# Patient Record
Sex: Female | Born: 1975 | Race: White | Hispanic: No | Marital: Single | Smoking: Former smoker
Health system: Southern US, Community
[De-identification: ages and names within clinical notes are randomized; demographics above are authoritative.]

## PROBLEM LIST (undated history)

## (undated) DIAGNOSIS — J45909 Unspecified asthma, uncomplicated: Secondary | ICD-10-CM

## (undated) HISTORY — PX: OTHER SURGICAL HISTORY: SHX169

---

## 2008-11-20 ENCOUNTER — Emergency Department (HOSPITAL_COMMUNITY): Admission: EM | Admit: 2008-11-20 | Discharge: 2008-11-20 | Payer: Self-pay | Admitting: Emergency Medicine

## 2009-01-06 ENCOUNTER — Emergency Department (HOSPITAL_COMMUNITY): Admission: EM | Admit: 2009-01-06 | Discharge: 2009-01-06 | Payer: Self-pay | Admitting: Emergency Medicine

## 2010-09-05 LAB — URINALYSIS, ROUTINE W REFLEX MICROSCOPIC
Bilirubin Urine: NEGATIVE
Ketones, ur: 15 mg/dL — AB
Nitrite: NEGATIVE
Protein, ur: NEGATIVE mg/dL
Specific Gravity, Urine: 1.01 (ref 1.005–1.030)
Urobilinogen, UA: 0.2 mg/dL (ref 0.0–1.0)

## 2010-09-05 LAB — DIFFERENTIAL
Eosinophils Absolute: 0.1 10*3/uL (ref 0.0–0.7)
Lymphs Abs: 1.3 10*3/uL (ref 0.7–4.0)
Monocytes Absolute: 1.1 10*3/uL — ABNORMAL HIGH (ref 0.1–1.0)
Monocytes Relative: 7 % (ref 3–12)
Neutro Abs: 13 10*3/uL — ABNORMAL HIGH (ref 1.7–7.7)
Neutrophils Relative %: 84 % — ABNORMAL HIGH (ref 43–77)

## 2010-09-05 LAB — BASIC METABOLIC PANEL
CO2: 24 mEq/L (ref 19–32)
Calcium: 8.1 mg/dL — ABNORMAL LOW (ref 8.4–10.5)
GFR calc Af Amer: >60 mL/min (ref >60)
Glucose, Bld: 99 mg/dL (ref 70–99)
Potassium: 3.4 mEq/L — ABNORMAL LOW (ref 3.5–5.1)
Sodium: 136 mEq/L (ref 135–145)

## 2010-09-05 LAB — URINE MICROSCOPIC-ADD ON

## 2010-09-05 LAB — CBC
HCT: 33.2 % — ABNORMAL LOW (ref 36.0–46.0)
Hemoglobin: 11.3 g/dL — ABNORMAL LOW (ref 12.0–15.0)
MCHC: 34.1 g/dL (ref 30.0–36.0)
RBC: 4.13 MIL/uL (ref 3.87–5.11)

## 2013-08-05 ENCOUNTER — Emergency Department (HOSPITAL_COMMUNITY): Payer: Self-pay

## 2013-08-05 ENCOUNTER — Encounter (HOSPITAL_COMMUNITY): Payer: Self-pay | Admitting: Emergency Medicine

## 2013-08-05 ENCOUNTER — Emergency Department (HOSPITAL_COMMUNITY)
Admission: EM | Admit: 2013-08-05 | Discharge: 2013-08-05 | Disposition: A | Discharge status: Home / Self Care | Payer: Self-pay | Attending: Emergency Medicine | Admitting: Emergency Medicine

## 2013-08-05 DIAGNOSIS — Z79899 Other long term (current) drug therapy: Secondary | ICD-10-CM | POA: Insufficient documentation

## 2013-08-05 DIAGNOSIS — J209 Acute bronchitis, unspecified: Secondary | ICD-10-CM | POA: Insufficient documentation

## 2013-08-05 DIAGNOSIS — R509 Fever, unspecified: Secondary | ICD-10-CM | POA: Insufficient documentation

## 2013-08-05 DIAGNOSIS — Z87891 Personal history of nicotine dependence: Secondary | ICD-10-CM | POA: Insufficient documentation

## 2013-08-05 LAB — CBC WITH DIFFERENTIAL/PLATELET
BASOS ABS: 0 10*3/uL (ref 0.0–0.1)
Basophils Relative: 0 % (ref 0–1)
Eosinophils Absolute: 0.5 10*3/uL (ref 0.0–0.7)
Eosinophils Relative: 6 % — ABNORMAL HIGH (ref 0–5)
HEMATOCRIT: 44.3 % (ref 36.0–46.0)
Hemoglobin: 14.9 g/dL (ref 12.0–15.0)
LYMPHS PCT: 42 % (ref 12–46)
Lymphs Abs: 4 10*3/uL (ref 0.7–4.0)
MCH: 29.9 pg (ref 26.0–34.0)
MCHC: 33.6 g/dL (ref 30.0–36.0)
MCV: 89 fL (ref 78.0–100.0)
Monocytes Absolute: 0.4 10*3/uL (ref 0.1–1.0)
Monocytes Relative: 4 % (ref 3–12)
NEUTROS ABS: 4.6 10*3/uL (ref 1.7–7.7)
NEUTROS PCT: 48 % (ref 43–77)
PLATELETS: 315 10*3/uL (ref 150–400)
RBC: 4.98 MIL/uL (ref 3.87–5.11)
RDW: 13.6 % (ref 11.5–15.5)
WBC: 9.5 10*3/uL (ref 4.0–10.5)

## 2013-08-05 LAB — COMPREHENSIVE METABOLIC PANEL
ALBUMIN: 4 g/dL (ref 3.5–5.2)
ALT: 14 U/L (ref 0–35)
AST: 19 U/L (ref 0–37)
Alkaline Phosphatase: 71 U/L (ref 39–117)
BILIRUBIN TOTAL: 0.2 mg/dL — AB (ref 0.3–1.2)
BUN: 7 mg/dL (ref 6–23)
CHLORIDE: 106 meq/L (ref 96–112)
CO2: 27 meq/L (ref 19–32)
Calcium: 9 mg/dL (ref 8.4–10.5)
Creatinine, Ser: 0.79 mg/dL (ref 0.50–1.10)
GFR calc Af Amer: >90 mL/min (ref >90)
Glucose, Bld: 85 mg/dL (ref 70–99)
POTASSIUM: 4.5 meq/L (ref 3.7–5.3)
SODIUM: 143 meq/L (ref 137–147)
Total Protein: 7.7 g/dL (ref 6.0–8.3)

## 2013-08-05 MED ORDER — PREDNISONE 20 MG PO TABS: 60.0000 mg | ORAL_TABLET | Freq: Every day | ORAL | Status: DC

## 2013-08-05 MED ORDER — ALBUTEROL SULFATE HFA 108 (90 BASE) MCG/ACT IN AERS
2.0000 | INHALATION_SPRAY | RESPIRATORY_TRACT | Status: DC | PRN
  Administered 2013-08-05: 2 via RESPIRATORY_TRACT
  Filled 2013-08-05: qty 6.7

## 2013-08-05 MED ORDER — METHYLPREDNISOLONE SODIUM SUCC 125 MG IJ SOLR
125.0000 mg | Freq: Once | INTRAMUSCULAR | Status: AC
  Administered 2013-08-05: 125 mg via INTRAVENOUS
  Filled 2013-08-05: qty 2

## 2013-08-05 MED ORDER — IPRATROPIUM-ALBUTEROL 0.5-2.5 (3) MG/3ML IN SOLN: 3.0000 mL | RESPIRATORY_TRACT | Status: DC

## 2013-08-05 MED ORDER — ALBUTEROL SULFATE (2.5 MG/3ML) 0.083% IN NEBU
2.5000 mg | INHALATION_SOLUTION | Freq: Once | RESPIRATORY_TRACT | Status: AC
  Administered 2013-08-05: 2.5 mg via RESPIRATORY_TRACT
  Filled 2013-08-05: qty 3

## 2013-08-05 MED ORDER — AMOXICILLIN 500 MG PO CAPS: 500.0000 mg | ORAL_CAPSULE | Freq: Three times a day (TID) | ORAL | Status: DC

## 2013-08-05 MED ORDER — IPRATROPIUM-ALBUTEROL 0.5-2.5 (3) MG/3ML IN SOLN
3.0000 mL | Freq: Once | RESPIRATORY_TRACT | Status: AC
  Administered 2013-08-05: 3 mL via RESPIRATORY_TRACT
  Filled 2013-08-05: qty 3

## 2013-08-05 MED ORDER — ALBUTEROL SULFATE HFA 108 (90 BASE) MCG/ACT IN AERS: 2.0000 | INHALATION_SPRAY | RESPIRATORY_TRACT | Status: AC | PRN

## 2013-08-05 NOTE — ED Notes (Signed)
RT notified of orders 

## 2013-08-05 NOTE — ED Notes (Signed)
PT C/O SOB. NO RELIEF WITH INHALER.

## 2013-08-05 NOTE — Discharge Instructions (Signed)

## 2013-08-05 NOTE — ED Notes (Signed)
After neb tx pt state sshe feels much better, breath sounds clear bilaterally. Breathing unlabored

## 2013-08-05 NOTE — ED Provider Notes (Signed)
CSN: [id number]     Arrival date & time 08/05/13  1906 History   First MD Initiated Contact with Patient 08/05/13 1916     Chief Complaint  Patient presents with  . Shortness of Breath     (Consider location/radiation/quality/duration/timing/severity/associated sxs/prior Treatment) HPI Comments: Patient presents to the ER for evaluation of difficulty breathing. Patient reports that she has been having trouble with intermittent shortness of breath and wheezing over a period of approximately 4 months. She is been borrowing inhalers from her family and using over-the-counter Mucinex. The last couple of days, her shortness of breath has significantly worsened. She thinks she has been running fevers at home. She has had persistent cough. She reports that the last time she felt like she had walking pneumonia.  Patient is a 38 y.o. female presenting with shortness of breath.  Shortness of Breath Associated symptoms: cough, fever and wheezing     History reviewed. No pertinent past medical history. Past Surgical History  Procedure Laterality Date  . Arm surgery     History reviewed. No pertinent family history. History  Substance Use Topics  . Smoking status: Former Games developer  . Smokeless tobacco: Not on file  . Alcohol Use: No   OB History   Grav Para Term Preterm Abortions TAB SAB Ect Mult Living                 Review of Systems  Constitutional: Positive for fever.  Respiratory: Positive for cough, shortness of breath and wheezing.   All other systems reviewed and are negative.      Allergies  Aspirin  Home Medications   Current Outpatient Rx  Name  Route  Sig  Dispense  Refill  . Cyanocobalamin (B-12 PO)   Oral   Take 1 tablet by mouth daily.         Marland Kitchen ibuprofen (ADVIL,MOTRIN) 200 MG tablet   Oral   Take 200 mg by mouth every 6 (six) hours as needed for fever or mild pain.         . IRON PO   Oral   Take 1 tablet by mouth daily.         . Multiple Vitamin  (MULTIVITAMIN WITH MINERALS) TABS tablet   Oral   Take 1 tablet by mouth daily.         Marland Kitchen Phenylephrine-APAP-Guaifenesin (MUCINEX FAST-MAX COLD & SINUS) 10-650-400 MG/20ML LIQD   Oral   Take 15 mLs by mouth every 4 (four) hours as needed (for congestion/relief).         . Pseudoeph-Doxylamine-DM-APAP (NYQUIL MULTI-SYMPTOM PO)   Oral   Take 10-15 mLs by mouth daily as needed (for congestion/relief).          SpO2 99%  LMP 07/20/2013 Physical Exam  Constitutional: She is oriented to person, place, and time. She appears well-developed and well-nourished. No distress.  HENT:  Head: Normocephalic and atraumatic.  Right Ear: Hearing normal.  Left Ear: Hearing normal.  Nose: Nose normal.  Mouth/Throat: Oropharynx is clear and moist and mucous membranes are normal.  Eyes: Conjunctivae and EOM are normal. Pupils are equal, round, and reactive to light.  Neck: Normal range of motion. Neck supple.  Cardiovascular: Regular rhythm, S1 normal and S2 normal.  Exam reveals no gallop and no friction rub.   No murmur heard. Pulmonary/Chest: She is in respiratory distress. She has wheezes. She exhibits no tenderness.  Abdominal: Soft. Normal appearance and bowel sounds are normal. There is no hepatosplenomegaly. There is  no tenderness. There is no rebound, no guarding, no tenderness at McBurney's point and negative Murphy's sign. No hernia.  Musculoskeletal: Normal range of motion.  Neurological: She is alert and oriented to person, place, and time. She has normal strength. No cranial nerve deficit or sensory deficit. Coordination normal. GCS eye subscore is 4. GCS verbal subscore is 5. GCS motor subscore is 6.  Skin: Skin is warm, dry and intact. No rash noted. No cyanosis.  Psychiatric: She has a normal mood and affect. Her speech is normal and behavior is normal. Thought content normal.    ED Course  Procedures (including critical care time) Labs Review Labs Reviewed  CBC WITH  DIFFERENTIAL - Abnormal; Notable for the following:    Eosinophils Relative 6 (*)    All other components within normal limits  COMPREHENSIVE METABOLIC PANEL - Abnormal; Notable for the following:    Total Bilirubin 0.2 (*)    All other components within normal limits   Imaging Review Dg Chest 2 View  08/05/2013   CLINICAL DATA Shortness of breath for 4 months  EXAM CHEST  2 VIEW  COMPARISON None.  FINDINGS The heart size and mediastinal contours are within normal limits. Both lungs are clear. The visualized skeletal structures are unremarkable.  IMPRESSION No active cardiopulmonary disease.  SIGNATURE  Electronically Signed   By: Elige KoHetal  Patel   On: 08/05/2013 20:16     EKG Interpretation None      MDM   Final diagnoses:  Bronchitis with bronchospasm     Patient presented to the ER with moderate respiratory distress secondary to bronchospasm. She reports a history of recurrent bronchitis but no firm diagnosis of asthma. She has been using albuterol inhalers intermittently, given to her by family members. Tonight, however, she had significant wheezing without significant hypoxia. She significantly improved after nebulized albuterol and Atrovent. Repeat examination reveals that she is breathing comfortably without wheezing. Her x-ray was clear, no pneumonia. She is appropriate for outpatient treatment with continued prednisone, albuterol and will add antibiotic coverage, amoxicillin. Return to the ER if symptoms worsen.    Gilda Creasehristopher J. Pollina, MD 08/05/13 2036

## 2020-09-10 ENCOUNTER — Emergency Department (HOSPITAL_COMMUNITY): Payer: Medicaid - Out of State

## 2020-09-10 ENCOUNTER — Emergency Department (HOSPITAL_COMMUNITY)
Admission: EM | Admit: 2020-09-10 | Discharge: 2020-09-10 | Disposition: A | Discharge status: Home / Self Care | Payer: Medicaid - Out of State | Attending: Emergency Medicine | Admitting: Emergency Medicine

## 2020-09-10 ENCOUNTER — Encounter (HOSPITAL_COMMUNITY): Payer: Self-pay

## 2020-09-10 ENCOUNTER — Other Ambulatory Visit: Payer: Self-pay

## 2020-09-10 DIAGNOSIS — R059 Cough, unspecified: Secondary | ICD-10-CM | POA: Diagnosis not present

## 2020-09-10 DIAGNOSIS — Z91040 Latex allergy status: Secondary | ICD-10-CM | POA: Insufficient documentation

## 2020-09-10 DIAGNOSIS — R000 Tachycardia, unspecified: Secondary | ICD-10-CM | POA: Insufficient documentation

## 2020-09-10 DIAGNOSIS — R062 Wheezing: Secondary | ICD-10-CM | POA: Diagnosis not present

## 2020-09-10 DIAGNOSIS — Z79899 Other long term (current) drug therapy: Secondary | ICD-10-CM | POA: Diagnosis not present

## 2020-09-10 DIAGNOSIS — Z87891 Personal history of nicotine dependence: Secondary | ICD-10-CM | POA: Diagnosis not present

## 2020-09-10 DIAGNOSIS — R0602 Shortness of breath: Secondary | ICD-10-CM | POA: Diagnosis present

## 2020-09-10 DIAGNOSIS — R0902 Hypoxemia: Secondary | ICD-10-CM | POA: Insufficient documentation

## 2020-09-10 HISTORY — DX: Unspecified asthma, uncomplicated: J45.909

## 2020-09-10 LAB — CBC
HCT: 46.5 % — ABNORMAL HIGH (ref 36.0–46.0)
Hemoglobin: 13.9 g/dL (ref 12.0–15.0)
MCH: 26.3 pg (ref 26.0–34.0)
MCHC: 29.9 g/dL — ABNORMAL LOW (ref 30.0–36.0)
MCV: 88.1 fL (ref 80.0–100.0)
Platelets: 385 10*3/uL (ref 150–400)
RBC: 5.28 MIL/uL — ABNORMAL HIGH (ref 3.87–5.11)
RDW: 15.5 % (ref 11.5–15.5)
WBC: 11.3 10*3/uL — ABNORMAL HIGH (ref 4.0–10.5)
nRBC: 0 % (ref 0.0–0.2)

## 2020-09-10 LAB — BASIC METABOLIC PANEL
Anion gap: 10 (ref 5–15)
BUN: 15 mg/dL (ref 6–20)
CO2: 24 mmol/L (ref 22–32)
Calcium: 8.3 mg/dL — ABNORMAL LOW (ref 8.9–10.3)
Chloride: 104 mmol/L (ref 98–111)
Creatinine, Ser: 0.82 mg/dL (ref 0.44–1.00)
GFR, Estimated: >60 mL/min (ref >60)
Glucose, Bld: 132 mg/dL — ABNORMAL HIGH (ref 70–99)
Potassium: 3.7 mmol/L (ref 3.5–5.1)
Sodium: 138 mmol/L (ref 135–145)

## 2020-09-10 MED ORDER — IOHEXOL 350 MG/ML SOLN
100.0000 mL | Freq: Once | INTRAVENOUS | Status: AC | PRN
  Administered 2020-09-10: 100 mL via INTRAVENOUS

## 2020-09-10 MED ORDER — PREDNISONE 10 MG PO TABS
60.0000 mg | ORAL_TABLET | Freq: Once | ORAL | Status: AC
  Administered 2020-09-10: 60 mg via ORAL
  Filled 2020-09-10: qty 1

## 2020-09-10 MED ORDER — PREDNISONE 20 MG PO TABS: 40.0000 mg | ORAL_TABLET | Freq: Every day | ORAL | 0 refills | Status: AC

## 2020-09-10 MED ORDER — ALBUTEROL SULFATE HFA 108 (90 BASE) MCG/ACT IN AERS
2.0000 | INHALATION_SPRAY | RESPIRATORY_TRACT | Status: DC | PRN
Start: 2020-09-10 — End: 2020-09-11
  Administered 2020-09-10: 2 via RESPIRATORY_TRACT
  Filled 2020-09-10: qty 6.7

## 2020-09-10 NOTE — ED Triage Notes (Signed)
Pt reports shortness of breath that has been intermittent for months. Pt says she has been seen by different hospitals and prescribed numerous anabiotics with no relief.  Pt is short of breath after walking from lobby to ED room 7. Pt O2 is 90% upon arrival to room, then comes up to 95% after sitting for a few minutes.

## 2020-09-10 NOTE — ED Provider Notes (Signed)
[facility] EMERGENCY DEPARTMENT Provider Note   CSN: [id number] Arrival date & time: 09/10/20  2019     History Chief Complaint  Patient presents with  . Shortness of Breath    Emily Powers is a 45 y.o. female.  HPI Patient presents with shortness of breath.  Reportedly has had for months.  States it may have started after pneumonia around a year ago.  States she is been told her lungs are scarred.  States that she has followed up only in the ER.  States she gets inhalers and sometimes steroids and antibiotics.  States she does better while she is on the steroids and antibiotics.  Has never followed with pulmonologist.  States she has had her COVID vaccines and actually gets worse after she has had her vaccines.  Had been a former smoker years ago.  No chest pain.  Has had some sputum production.  Did get short of breath with sats down to 90 just to walk back into the room.  Patient has Singulair inhaler and albuterol inhaler but the albuterol inhaler is empty.  Also has Keflex prescription.    Past Medical History:  Diagnosis Date  . Asthma     There are no problems to display for this patient.   Past Surgical History:  Procedure Laterality Date  . ARM SURGERY       OB History   No obstetric history on file.     No family history on file.  Social History   Tobacco Use  . Smoking status: Former Games developer  . Smokeless tobacco: Never Used  . Tobacco comment: former social smoker years ago  Substance Use Topics  . Alcohol use: No  . Drug use: No    Home Medications Prior to Admission medications   Medication Sig Start Date End Date Taking? Authorizing Provider  albuterol (PROVENTIL HFA;VENTOLIN HFA) 108 (90 BASE) MCG/ACT inhaler Inhale 2 puffs into the lungs every 4 (four) hours as needed for wheezing or shortness of breath. 08/05/13  Yes Pollina, Canary Brim, MD  cephALEXin (KEFLEX) 500 MG capsule Take 500 mg by mouth every 6 (six) hours as needed.   Yes  [provider]  naproxen sodium (ALEVE) 220 MG tablet Take 220 mg by mouth.   Yes [provider]  predniSONE (DELTASONE) 20 MG tablet Take 2 tablets (40 mg total) by mouth daily for 5 days. 09/10/20 09/15/20 Yes Benjiman Core, MD  Cyanocobalamin (B-12 PO) Take 1 tablet by mouth daily. Patient not taking: Reported on 09/10/2020    [provider]  ibuprofen (ADVIL,MOTRIN) 200 MG tablet Take 200 mg by mouth every 6 (six) hours as needed for fever or mild pain. Patient not taking: Reported on 09/10/2020    [provider]  IRON PO Take 1 tablet by mouth daily. Patient not taking: Reported on 09/10/2020    [provider]  Multiple Vitamin (MULTIVITAMIN WITH MINERALS) TABS tablet Take 1 tablet by mouth daily. Patient not taking: Reported on 09/10/2020    [provider]  Phenylephrine-APAP-guaiFENesin 10-650-400 MG/20ML LIQD Take 15 mLs by mouth every 4 (four) hours as needed (for congestion/relief). Patient not taking: Reported on 09/10/2020    [provider]  Pseudoeph-Doxylamine-DM-APAP (NYQUIL MULTI-SYMPTOM PO) Take 10-15 mLs by mouth daily as needed (for congestion/relief). Patient not taking: Reported on 09/10/2020    [provider]    Allergies    Latex and Aspirin  Review of Systems   Review of Systems  Constitutional: Negative  for appetite change.  HENT: Positive for congestion.   Respiratory: Positive for cough, shortness of breath and wheezing.   Cardiovascular: Negative for chest pain and leg swelling.  Genitourinary: Negative for flank pain.  Musculoskeletal: Negative for back pain.  Skin: Negative for rash.  Neurological: Negative for weakness.  Psychiatric/Behavioral: Negative for confusion.    Physical Exam Updated Vital Signs BP (!) 101/54   Pulse 97   Temp 98.2 F (36.8 C) (Oral)   Resp (!) 22   Ht 5\' 4"  (1.626 m)   Wt 90.7 kg   SpO2 95%   BMI 34.33 kg/m   Physical Exam Vitals and  nursing note reviewed.  HENT:     Head: Normocephalic.  Eyes:     Pupils: Pupils are equal, round, and reactive to light.  Cardiovascular:     Rate and Rhythm: Regular rhythm. Tachycardia present.  Pulmonary:     Breath sounds: Wheezing present.     Comments: Diffuse wheezes and prolonged expirations.  Occasional cough. Musculoskeletal:     Right lower leg: No edema.     Left lower leg: No edema.  Skin:    General: Skin is warm.     Capillary Refill: Capillary refill takes less than 2 seconds.  Neurological:     Mental Status: She is alert and oriented to person, place, and time.     ED Results / Procedures / Treatments   Labs (all labs ordered are listed, but only abnormal results are displayed) Labs Reviewed  BASIC METABOLIC PANEL - Abnormal; Notable for the following components:      Result Value   Glucose, Bld 132 (*)    Calcium 8.3 (*)    All other components within normal limits  CBC - Abnormal; Notable for the following components:   WBC 11.3 (*)    RBC 5.28 (*)    HCT 46.5 (*)    MCHC 29.9 (*)    All other components within normal limits    EKG None  Radiology CT Angio Chest PE W and/or Wo Contrast  Result Date: 09/10/2020 CLINICAL DATA:  Hypoxia EXAM: CT ANGIOGRAPHY CHEST WITH CONTRAST TECHNIQUE: Multidetector CT imaging of the chest was performed using the standard protocol during bolus administration of intravenous contrast. Multiplanar CT image reconstructions and MIPs were obtained to evaluate the vascular anatomy. CONTRAST:  09/12/2020 OMNIPAQUE IOHEXOL 350 MG/ML SOLN COMPARISON:  Chest x-ray from earlier in the same day, CT from 12/04/2019. FINDINGS: Cardiovascular: Thoracic aorta shows no aneurysmal dilatation or dissection. No cardiac enlargement is seen. No significant coronary calcifications are noted. Pulmonary artery shows a normal branching pattern. No filling defects to suggest pulmonary emboli are seen. Mediastinum/Nodes: Thoracic inlet is within normal  limits. Stable mediastinal lymph nodes are noted unchanged from 12/04/2019. Small hilar nodes are seen likely reactive in nature. The esophagus as visualized is within normal limits. Lungs/Pleura: Lungs are well aerated bilaterally. Diffuse emphysematous changes are noted the accentuating the interstitial markings this corresponds to the changes seen on recent chest x-ray. A few small less than 5 mm nodules are noted in the left lower lobe. No sizable parenchymal nodule is seen. Upper Abdomen: No acute abnormality. Musculoskeletal: Degenerative changes of the thoracic spine are seen. No acute bony abnormality is noted. Review of the MIP images confirms the above findings. IMPRESSION: No evidence of pulmonary emboli. Chronic reactive hilar and mediastinal lymph nodes stable from prior CT. Emphysematous changes stable in appearance from 2021. Stable small left lower lobe nodules measuring 5  mm and less. No follow-up needed if patient is low-risk (and has no known or suspected primary neoplasm). Non-contrast chest CT can be considered in 12 months if patient is high-risk. This recommendation follows the consensus statement: Guidelines for Management of Incidental Pulmonary Nodules Detected on CT Images: From the Fleischner Society 2017; Radiology 2017; 284:228-243. Emphysema (ICD10-J43.9). Electronically Signed   By: Alcide Clever M.D.   On: 09/10/2020 22:37   DG Chest Portable 1 View  Result Date: 09/10/2020 CLINICAL DATA:  The patient states SOB intermittent for months but worsening. Cough noted. Hx of asthma and former smoker. EXAM: PORTABLE CHEST 1 VIEW COMPARISON:  Chest radiograph 12/26/2019 FINDINGS: The heart size and mediastinal contours are within normal limits. There are diffuse fine interstitial opacities similar in appearance to the prior study. No new focal consolidation. No pneumothorax or pleural effusion. No acute finding in the visualized skeleton. IMPRESSION: Diffuse fine interstitial opacities  similar to the prior study. No definite acute superimposed process. Electronically Signed   By: Emmaline Kluver M.D.   On: 09/10/2020 21:17    Procedures Procedures   Medications Ordered in ED Medications  albuterol (VENTOLIN HFA) 108 (90 Base) MCG/ACT inhaler 2 puff (2 puffs Inhalation Given 09/10/20 2107)  iohexol (OMNIPAQUE) 350 MG/ML injection 100 mL (100 mLs Intravenous Contrast Given 09/10/20 2224)  predniSONE (DELTASONE) tablet 60 mg (60 mg Oral Given 09/10/20 2248)    ED Course  I have reviewed the triage vital signs and the nursing notes.  Pertinent labs & imaging results that were available during my care of the patient were reviewed by me and considered in my medical decision making (see chart for details).    MDM Rules/Calculators/A&P                          Patient presents with shortness of breath.  History of same.  States she has had it for a year but reviewing records it likely has been longer than that.  States began after pneumonia.  Sounds if she gets recurrent infections and current steroids and antibiotics.  Denies any previous CT imaging.  Although after our CT had been done likely showed an old one with similar emphysematous findings.  Has some mild hypoxia with walking.  Will give steroids.  Has been given albuterol and feels little better.  However I think outpatient follow-up with pulmonary is most important and will likely benefit most in the long run.  Will discharge home.  No pneumonia seen on CT scan Final Clinical Impression(s) / ED Diagnoses Final diagnoses:  Wheezing    Rx / DC Orders ED Discharge Orders         Ordered    predniSONE (DELTASONE) 20 MG tablet  Daily        09/10/20 2248           Benjiman Core, MD 09/10/20 2300

## 2020-09-10 NOTE — Discharge Instructions (Addendum)
Your CT showed lung changes that are potentially emphysema.  Follow-up with pulmonary for further evaluation.  Continue the steroids were given here and using inhalers as needed.

## 2020-09-10 NOTE — ED Notes (Signed)
Pt speaking with nurse and oxygen saturation dropped to 90% while speaking and pt began to have increased dyspnea. Nurse informed pt to rest and take a few deep breaths. After 2-3 minutes oxygen saturation improved.

## 2022-03-17 ENCOUNTER — Emergency Department (HOSPITAL_COMMUNITY): Payer: Medicaid Other

## 2022-03-17 ENCOUNTER — Emergency Department (HOSPITAL_COMMUNITY)
Admission: EM | Admit: 2022-03-17 | Discharge: 2022-03-17 | Payer: Medicaid Other | Attending: Emergency Medicine | Admitting: Emergency Medicine

## 2022-03-17 ENCOUNTER — Other Ambulatory Visit: Payer: Self-pay

## 2022-03-17 ENCOUNTER — Encounter (HOSPITAL_COMMUNITY): Payer: Self-pay | Admitting: *Deleted

## 2022-03-17 DIAGNOSIS — Z91040 Latex allergy status: Secondary | ICD-10-CM | POA: Insufficient documentation

## 2022-03-17 DIAGNOSIS — R000 Tachycardia, unspecified: Secondary | ICD-10-CM | POA: Diagnosis not present

## 2022-03-17 DIAGNOSIS — J45909 Unspecified asthma, uncomplicated: Secondary | ICD-10-CM | POA: Insufficient documentation

## 2022-03-17 DIAGNOSIS — Z20822 Contact with and (suspected) exposure to covid-19: Secondary | ICD-10-CM | POA: Diagnosis not present

## 2022-03-17 DIAGNOSIS — R0602 Shortness of breath: Secondary | ICD-10-CM

## 2022-03-17 DIAGNOSIS — J9601 Acute respiratory failure with hypoxia: Secondary | ICD-10-CM | POA: Insufficient documentation

## 2022-03-17 DIAGNOSIS — R7989 Other specified abnormal findings of blood chemistry: Secondary | ICD-10-CM | POA: Diagnosis not present

## 2022-03-17 LAB — CBC WITH DIFFERENTIAL/PLATELET
Abs Immature Granulocytes: 0.03 10*3/uL (ref 0.00–0.07)
Basophils Absolute: 0.1 10*3/uL (ref 0.0–0.1)
Basophils Relative: 1 %
Eosinophils Absolute: 0.9 10*3/uL — ABNORMAL HIGH (ref 0.0–0.5)
Eosinophils Relative: 8 %
HCT: 40.5 % (ref 36.0–46.0)
Hemoglobin: 13 g/dL (ref 12.0–15.0)
Immature Granulocytes: 0 %
Lymphocytes Relative: 12 %
Lymphs Abs: 1.2 10*3/uL (ref 0.7–4.0)
MCH: 29.3 pg (ref 26.0–34.0)
MCHC: 32.1 g/dL (ref 30.0–36.0)
MCV: 91.4 fL (ref 80.0–100.0)
Monocytes Absolute: 0.3 10*3/uL (ref 0.1–1.0)
Monocytes Relative: 3 %
Neutro Abs: 7.8 10*3/uL — ABNORMAL HIGH (ref 1.7–7.7)
Neutrophils Relative %: 76 %
Platelets: 267 10*3/uL (ref 150–400)
RBC: 4.43 MIL/uL (ref 3.87–5.11)
RDW: 14.6 % (ref 11.5–15.5)
WBC: 10.2 10*3/uL (ref 4.0–10.5)
nRBC: 0 % (ref 0.0–0.2)

## 2022-03-17 LAB — BASIC METABOLIC PANEL
Anion gap: 6 (ref 5–15)
BUN: 16 mg/dL (ref 6–20)
CO2: 26 mmol/L (ref 22–32)
Calcium: 8.8 mg/dL — ABNORMAL LOW (ref 8.9–10.3)
Chloride: 105 mmol/L (ref 98–111)
Creatinine, Ser: 1.43 mg/dL — ABNORMAL HIGH (ref 0.44–1.00)
GFR, Estimated: 46 mL/min — ABNORMAL LOW (ref >60)
Glucose, Bld: 114 mg/dL — ABNORMAL HIGH (ref 70–99)
Potassium: 3.8 mmol/L (ref 3.5–5.1)
Sodium: 137 mmol/L (ref 135–145)

## 2022-03-17 LAB — BLOOD GAS, VENOUS
Acid-Base Excess: 2 mmol/L (ref 0.0–2.0)
Bicarbonate: 27.8 mmol/L (ref 20.0–28.0)
Drawn by: 6508
O2 Saturation: 93 %
Patient temperature: 36.6
pCO2, Ven: 46 mmHg (ref 44–60)
pH, Ven: 7.39 (ref 7.25–7.43)
pO2, Ven: 61 mmHg — ABNORMAL HIGH (ref 32–45)

## 2022-03-17 LAB — TROPONIN I (HIGH SENSITIVITY)
Troponin I (High Sensitivity): 6 ng/L (ref <18)
Troponin I (High Sensitivity): 6 ng/L (ref <18)

## 2022-03-17 LAB — RESP PANEL BY RT-PCR (FLU A&B, COVID) ARPGX2
Influenza A by PCR: NEGATIVE
Influenza B by PCR: NEGATIVE
SARS Coronavirus 2 by RT PCR: NEGATIVE

## 2022-03-17 LAB — POC URINE PREG, ED: Preg Test, Ur: NEGATIVE

## 2022-03-17 MED ORDER — PREDNISONE 20 MG PO TABS: 60.0000 mg | ORAL_TABLET | Freq: Every day | ORAL | 0 refills | Status: AC

## 2022-03-17 MED ORDER — METHYLPREDNISOLONE SODIUM SUCC 125 MG IJ SOLR
125.0000 mg | Freq: Once | INTRAMUSCULAR | Status: AC
  Administered 2022-03-17: 125 mg via INTRAVENOUS
  Filled 2022-03-17: qty 2

## 2022-03-17 MED ORDER — IPRATROPIUM-ALBUTEROL 0.5-2.5 (3) MG/3ML IN SOLN
3.0000 mL | Freq: Once | RESPIRATORY_TRACT | Status: AC
  Administered 2022-03-17: 3 mL via RESPIRATORY_TRACT
  Filled 2022-03-17: qty 3

## 2022-03-17 MED ORDER — SODIUM CHLORIDE 0.9 % IV BOLUS
1000.0000 mL | Freq: Once | INTRAVENOUS | Status: AC
  Administered 2022-03-17: 1000 mL via INTRAVENOUS

## 2022-03-17 MED ORDER — MAGNESIUM SULFATE 2 GM/50ML IV SOLN
2.0000 g | Freq: Once | INTRAVENOUS | Status: AC
  Administered 2022-03-17: 2 g via INTRAVENOUS
  Filled 2022-03-17: qty 50

## 2022-03-17 NOTE — Discharge Instructions (Signed)
You were seen in the emergency department for shortness of breath after being without your oxygen.  You were given medications for an asthma exacerbation.  We are prescribing 4 more days of prednisone.  Please continue on your 3 L of oxygen.  Return to the emergency department if any worsening or concerning symptoms.

## 2022-03-17 NOTE — ED Provider Notes (Signed)
[facility] EMERGENCY DEPARTMENT Provider Note   CSN: [id number] Arrival date & time: 03/17/22  [id number]     History  Chief Complaint  Patient presents with   Shortness of Breath    Emily Powers is a 46 y.o. female.  She has a history of asthma and states she is supposed to be on 2.5 L of oxygen 24/7.  She is currently incarcerated for the last day and a half without her oxygen.  She said she is having pain in her chest and troubles breathing.  She denies tobacco use.  Chest pain is worse with coughing and breathing.  She said she had to lay on the floor to get any oxygen.  Initial saturations by EMS were in the 70s improved with oxygen.  The history is provided by the patient.  Shortness of Breath Severity:  Severe Onset quality:  Gradual Duration:  1 day Timing:  Constant Progression:  Unchanged Chronicity:  Recurrent Relieved by:  Nothing Worsened by:  Activity and coughing Ineffective treatments:  Rest Associated symptoms: chest pain, cough and wheezing   Associated symptoms: no abdominal pain, no fever and no sputum production   Risk factors: no tobacco use        Home Medications Prior to Admission medications   Medication Sig Start Date End Date Taking? Authorizing Provider  albuterol (PROVENTIL HFA;VENTOLIN HFA) 108 (90 BASE) MCG/ACT inhaler Inhale 2 puffs into the lungs every 4 (four) hours as needed for wheezing or shortness of breath. 08/05/13   Orpah Greek, MD  cephALEXin (KEFLEX) 500 MG capsule Take 500 mg by mouth every 6 (six) hours as needed.    [provider]  Cyanocobalamin (B-12 PO) Take 1 tablet by mouth daily. Patient not taking: Reported on 09/10/2020    [provider]  ibuprofen (ADVIL,MOTRIN) 200 MG tablet Take 200 mg by mouth every 6 (six) hours as needed for fever or mild pain. Patient not taking: Reported on 09/10/2020    [provider]  IRON PO Take 1 tablet by mouth daily. Patient not taking: Reported on  09/10/2020    [provider]  Multiple Vitamin (MULTIVITAMIN WITH MINERALS) TABS tablet Take 1 tablet by mouth daily. Patient not taking: Reported on 09/10/2020    [provider]  naproxen sodium (ALEVE) 220 MG tablet Take 220 mg by mouth.    [provider]  Phenylephrine-APAP-guaiFENesin 10-650-400 MG/20ML LIQD Take 15 mLs by mouth every 4 (four) hours as needed (for congestion/relief). Patient not taking: Reported on 09/10/2020    [provider]  Pseudoeph-Doxylamine-DM-APAP (NYQUIL MULTI-SYMPTOM PO) Take 10-15 mLs by mouth daily as needed (for congestion/relief). Patient not taking: Reported on 09/10/2020    [provider]      Allergies    Latex and Aspirin    Review of Systems   Review of Systems  Constitutional:  Negative for fever.  Respiratory:  Positive for cough, shortness of breath and wheezing. Negative for sputum production.   Cardiovascular:  Positive for chest pain.  Gastrointestinal:  Negative for abdominal pain.  Genitourinary:  Negative for dysuria.    Physical Exam Updated Vital Signs BP 133/84   Pulse 91   Temp 97.9 F (36.6 C) (Oral)   Resp 18   Ht 5\' 2"  (1.575 m)   Wt 90.7 kg   SpO2 92%   BMI 36.58 kg/m  Physical Exam Vitals and nursing note reviewed.  Constitutional:      General: She is not in acute  distress.    Appearance: She is well-developed.  HENT:     Head: Normocephalic and atraumatic.  Eyes:     Conjunctiva/sclera: Conjunctivae normal.  Cardiovascular:     Rate and Rhythm: Regular rhythm. Tachycardia present.     Heart sounds: No murmur heard. Pulmonary:     Effort: Accessory muscle usage present. No tachypnea or respiratory distress.     Breath sounds: Wheezing present.  Abdominal:     Palpations: Abdomen is soft.     Tenderness: There is no abdominal tenderness.  Musculoskeletal:        General: No swelling. Normal range of motion.     Cervical back: Neck supple.     Right lower leg:  No tenderness. No edema.     Left lower leg: No tenderness. No edema.  Skin:    General: Skin is warm and dry.     Capillary Refill: Capillary refill takes less than 2 seconds.  Neurological:     General: No focal deficit present.     Mental Status: She is alert.     ED Results / Procedures / Treatments   Labs (all labs ordered are listed, but only abnormal results are displayed) Labs Reviewed  BASIC METABOLIC PANEL - Abnormal; Notable for the following components:      Result Value   Glucose, Bld 114 (*)    Creatinine, Ser 1.43 (*)    Calcium 8.8 (*)    GFR, Estimated 46 (*)    All other components within normal limits  CBC WITH DIFFERENTIAL/PLATELET - Abnormal; Notable for the following components:   Neutro Abs 7.8 (*)    Eosinophils Absolute 0.9 (*)    All other components within normal limits  BLOOD GAS, VENOUS - Abnormal; Notable for the following components:   pO2, Ven 61 (*)    All other components within normal limits  RESP PANEL BY RT-PCR (FLU A&B, COVID) [id number]  POC URINE PREG, ED  TROPONIN I (HIGH SENSITIVITY)  TROPONIN I (HIGH SENSITIVITY)    EKG EKG Interpretation  Date/Time:  Friday March 17 2022 08:26:23 EDT Ventricular Rate:  97 PR Interval:  118 QRS Duration: 71 QT Interval:  390 QTC Calculation: 493 R Axis:   50 Text Interpretation: Sinus rhythm Borderline short PR interval ST elev, probable normal early repol pattern Borderline prolonged QT interval No old tracing to compare Confirmed by Aletta Edouard [phone]) on 03/17/2022 8:40:18 AM  Radiology DG Chest Port 1 View  Result Date: 03/17/2022 CLINICAL DATA:  Shortness of breath. EXAM: PORTABLE CHEST 1 VIEW COMPARISON:  Radiographs 12/21/2021 and 07/16/2021. CT 12/22/2021 and 09/10/2020. FINDINGS: 0844 hours. The heart size and mediastinal contours are stable. There is diffuse interstitial lung disease which appears stable to slightly improved compared with the most recent studies. No definite  superimposed airspace disease, edema, pleural effusion or pneumothorax. The bones appear unremarkable. Telemetry leads overlie the chest. IMPRESSION: Stable to slightly improved diffuse interstitial lung disease compared with most recent studies. Based on previous CTs, the possibility of Langerhans cell histiocytosis is raised. If the interstitial lung disease has not been definitively diagnosed, follow-up high-resolution chest CT should be considered. No definite acute superimposed process. Electronically Signed   By: Richardean Sale M.D.   On: 03/17/2022 09:02    Procedures .Critical Care  Performed by: Hayden Rasmussen, MD Authorized by: Hayden Rasmussen, MD   Critical care provider statement:    Critical care time (minutes):  45   Critical care time was exclusive  of:  Separately billable procedures and treating other patients   Critical care was necessary to treat or prevent imminent or life-threatening deterioration of the following conditions:  Respiratory failure   Critical care was time spent personally by me on the following activities:  Development of treatment plan with patient or surrogate, discussions with consultants, evaluation of patient's response to treatment, examination of patient, obtaining history from patient or surrogate, ordering and performing treatments and interventions, ordering and review of laboratory studies, ordering and review of radiographic studies, pulse oximetry, re-evaluation of patient's condition and review of old charts   I assumed direction of critical care for this patient from another provider in my specialty: no       Medications Ordered in ED Medications  magnesium sulfate IVPB 2 g 50 mL (0 g Intravenous Stopped 03/17/22 1059)  methylPREDNISolone sodium succinate (SOLU-MEDROL) 125 mg/2 mL injection 125 mg (125 mg Intravenous Given 03/17/22 0941)  ipratropium-albuterol (DUONEB) 0.5-2.5 (3) MG/3ML nebulizer solution 3 mL (3 mLs Nebulization Given  03/17/22 0932)  sodium chloride 0.9 % bolus 1,000 mL (0 mLs Intravenous Stopped 03/17/22 1248)    ED Course/ Medical Decision Making/ A&P Clinical Course as of 03/17/22 1726  Fri Mar 17, 2022  0917 Chest x-ray interpreted by me as diffuse interstitial markings especially down low.  Awaiting radiology reading. [MB]  [phone] I was informed patient had already received prednisone prior to arrival, so have cancelled Solu-Medrol. [MB]  [place] making arrangements [facility] facility can be found that can accommodate her need for oxygen.  She does not have a medical necessity for admission at this time but cannot be safely discharged without being on oxygen. [MB]  [place] [facility] on oxygen.  I have called for an ambulance to transport her there. [MB]    Clinical Course User Index [MB] Hayden Rasmussen, MD                           Medical Decision Making Amount and/or Complexity of Data Reviewed Labs: ordered. Radiology: ordered.  Risk Prescription drug management.   This patient complains of chest pain shortness of breath; this involves an extensive number of treatment Options and is a complaint that carries with it a high risk of complications and morbidity. The differential includes asthma, COPD, CHF, pneumothorax, PE  I ordered, reviewed and interpreted labs, which included CBC normal chemistries with mild elevation of creatinine, VBG without significant CO2 retention, pregnancy negative, troponins flat, COVID and flu negative I ordered medication IV fluids IV steroids and magnesium, DuoNeb, oxygen with improvement in her hypoxia and reviewed PMP when indicated. I ordered imaging studies which included chest x-ray and I independently    visualized and interpreted imaging which showed improved interstitial lung disease Additional history obtained from EMS Previous  records obtained and reviewed in epic no recent admissions Cardiac monitoring reviewed, normal sinus rhythm Social determinants considered, patient currently incarcerated without access to her oxygen Critical Interventions: Initiation of oxygen and medications for hypoxia  After the interventions stated above, I reevaluated the patient and found patient to be much more comfortable being on oxygen after interventions. Admission and further testing considered, no indications for admission to the hospital at this time.  [facility] department is taking her to a facility where she will be able to receive oxygen and  medications.  They understand to bring her back here if she is having any difficulties.         Final Clinical Impression(s) / ED Diagnoses Final diagnoses:  Shortness of breath  Acute hypoxic respiratory failure ([place])    Rx / DC Orders ED Discharge Orders          Ordered    predniSONE (DELTASONE) 20 MG tablet  Daily        03/17/22 1323              Hayden Rasmussen, MD 03/17/22 1729

## 2022-03-17 NOTE — ED Triage Notes (Signed)
Pt was brought in by [facility] for c/o sob; pt was arrested yesterday and taken to jail, pt in on 3L of O2 at all times and her concentrater did not accompany her to jail  Pt woke up this am with chest pain and O2 sats in the 70's per nurse at jail; pt was given prednisone and duoneb at jail and ems reports sats at 82%  Upon arrival to ED with ems pt O2 sats at 96% on 3L of O2    Pt c/o chest pressure

## 2022-11-24 IMAGING — CT CT ANGIO CHEST
2 of 6 series · 18 of 46 positions shown · IV contrast (Omnipaque or Isovue)
Comparison: Chest x-ray from earlier in the same day, CT from
12/04/2019.

CLINICAL DATA: Hypoxia

EXAM:
CT ANGIOGRAPHY CHEST WITH CONTRAST
TECHNIQUE: Multidetector CT imaging of the chest was performed using the
standard protocol during bolus administration of intravenous
contrast. Multiplanar CT image reconstructions and MIPs were
obtained to evaluate the vascular anatomy.
CONTRAST:  100mL OMNIPAQUE IOHEXOL 350 MG/ML SOLN

[Series 5: pe axial thins · axial · 0.70mm/px · z∈[+1133,+1413]mm · 15 of 382 slices shown]
[im 16/382  lung]
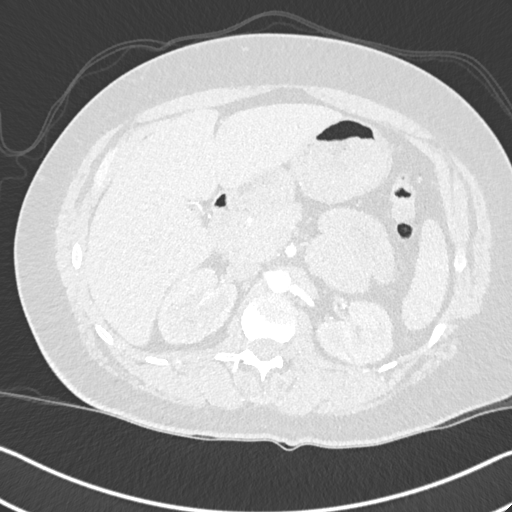
[im 48/382  soft-tissue]
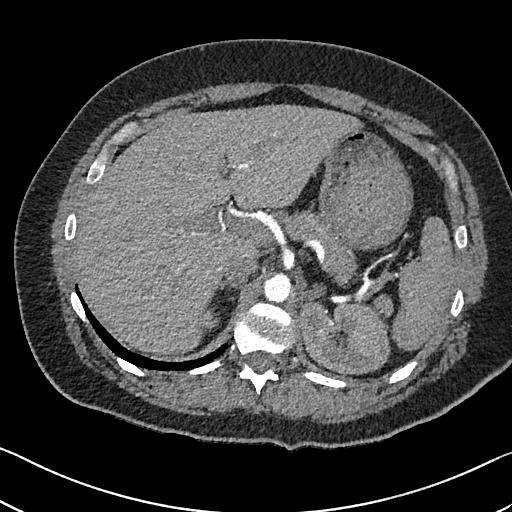
[im 64/382  lung]
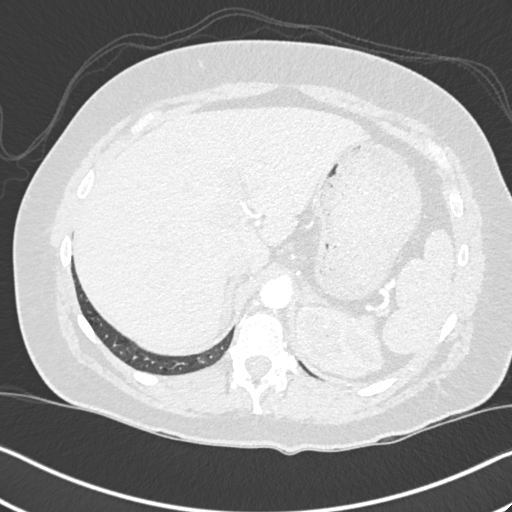
[im 96/382  soft-tissue]
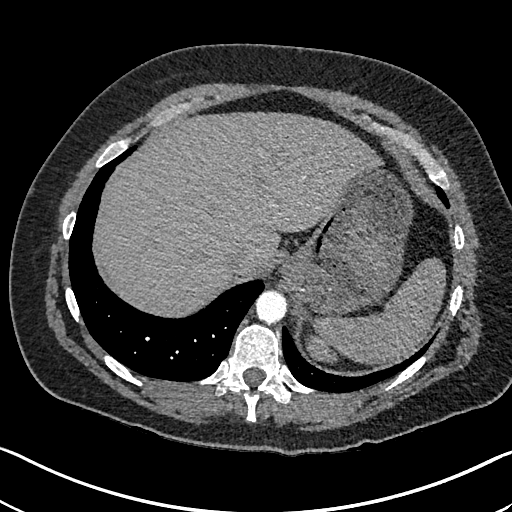
[im 112/382  lung]
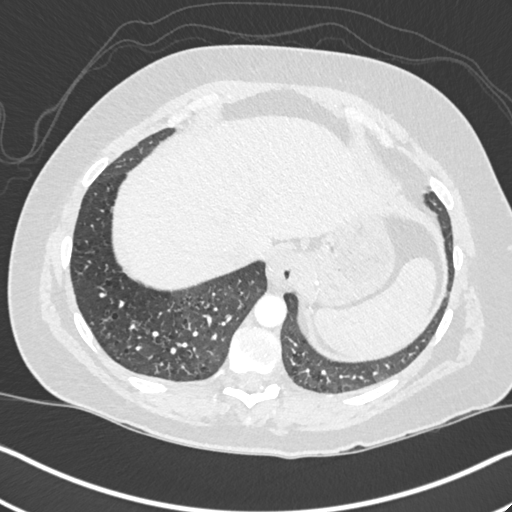
[im 143/382  soft-tissue]
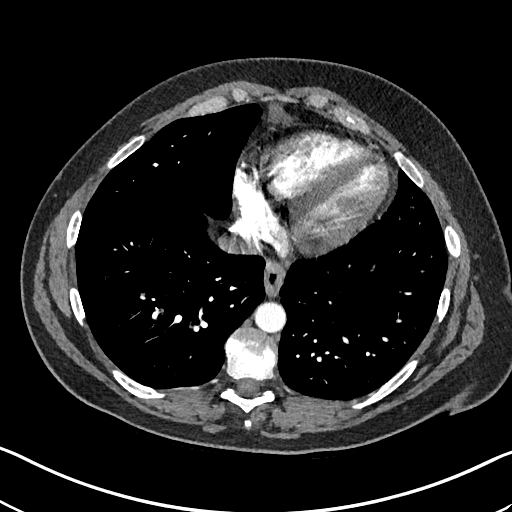
[im 159/382  lung]
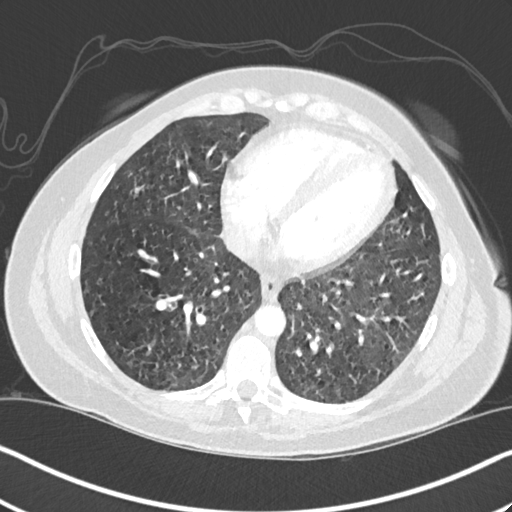
[im 191/382  soft-tissue]
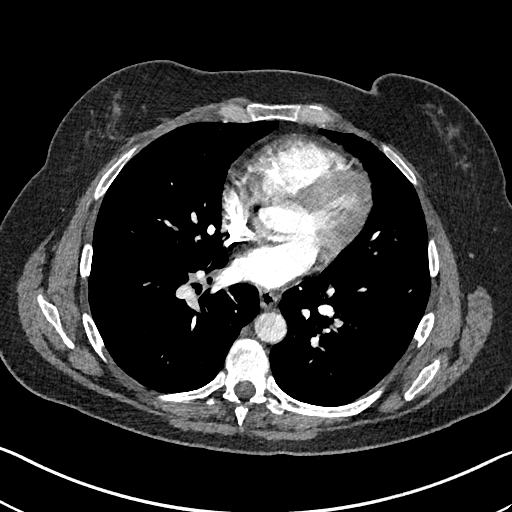
[im 223/382  lung]
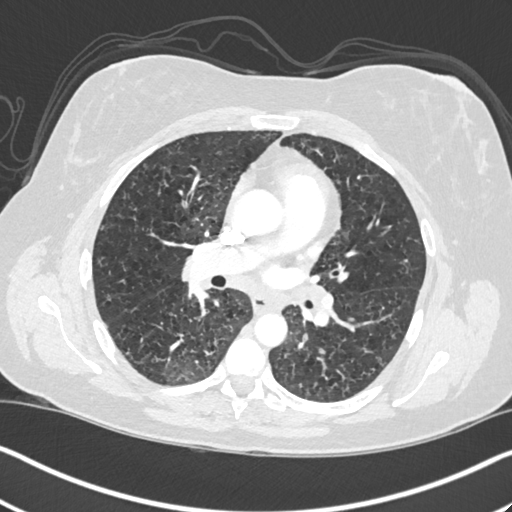
[im 239/382  soft-tissue]
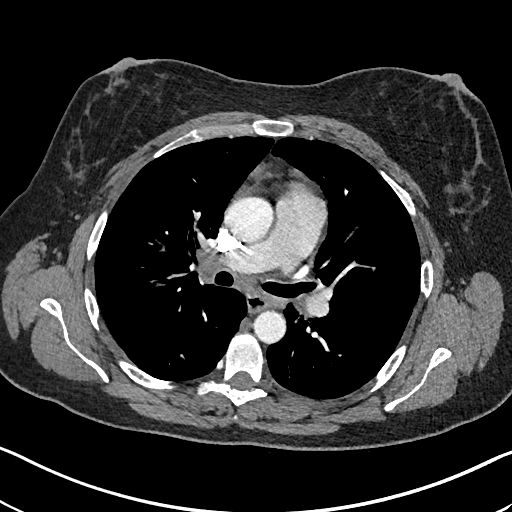
[im 270/382  lung]
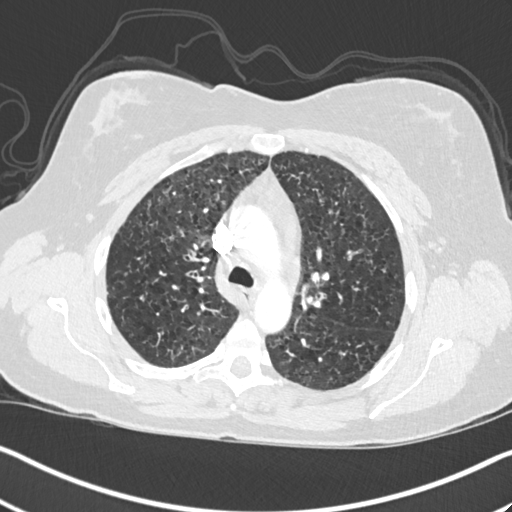
[im 286/382  soft-tissue]
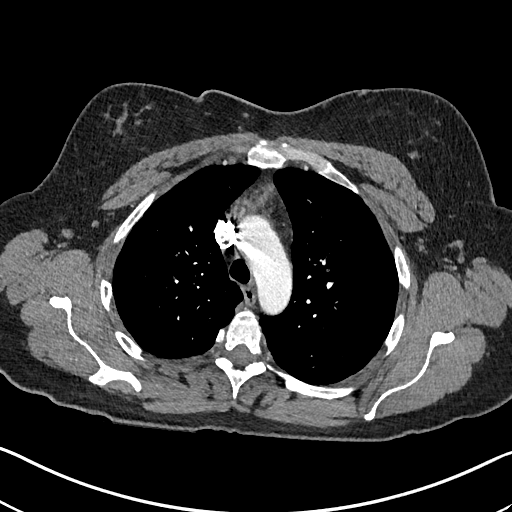
[im 318/382  lung]
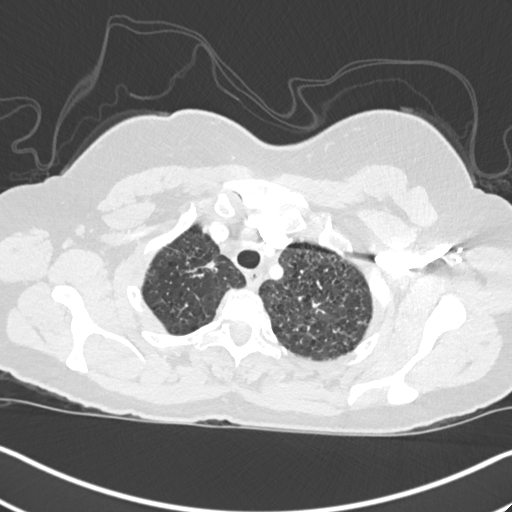
[im 334/382  soft-tissue]
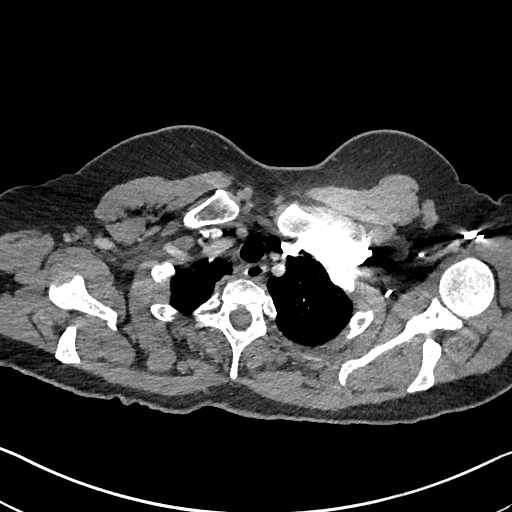
[im 366/382  lung]
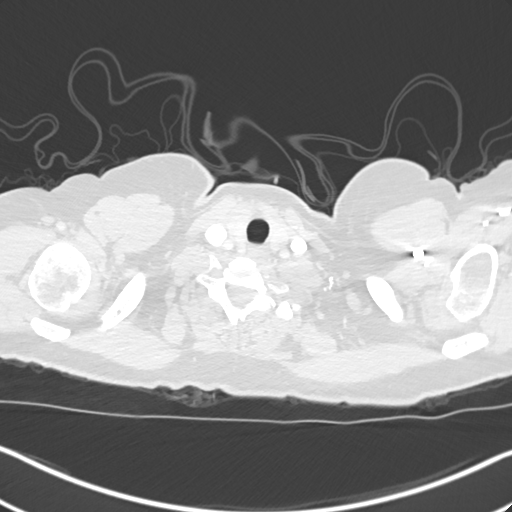

[Series 7: cor soft · coronal · 0.60mm/px · 3 of 149 slices shown]
[im 38/149  soft-tissue]
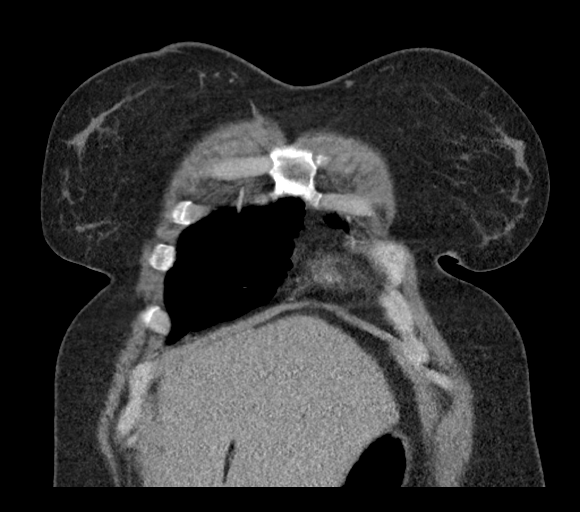
[im 75/149  soft-tissue]
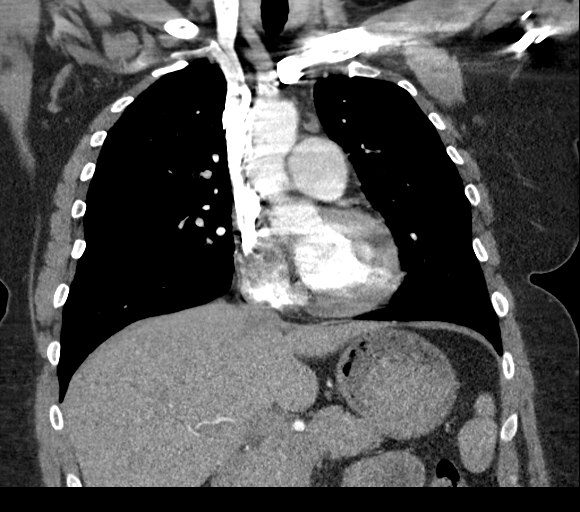
[im 112/149  soft-tissue]
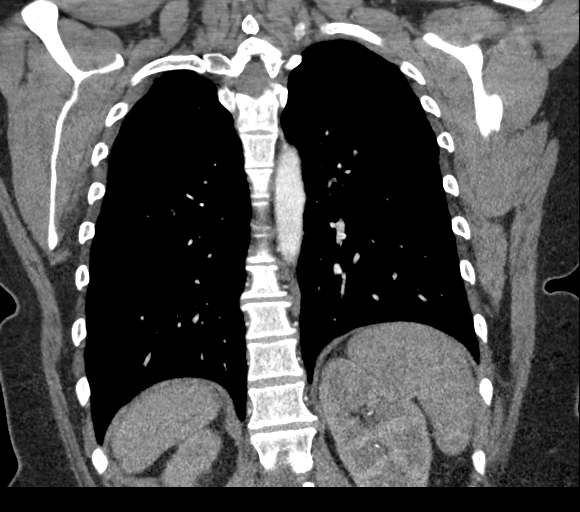

[18 of 46 positions shown; findings below may reference images not displayed]

FINDINGS: Cardiovascular: Thoracic aorta shows no aneurysmal dilatation or
dissection. No cardiac enlargement is seen. No significant coronary
calcifications are noted. Pulmonary artery shows a normal branching
pattern. No filling defects to suggest pulmonary emboli are seen.

Mediastinum/Nodes: Thoracic inlet is within normal limits. Stable
mediastinal lymph nodes are noted unchanged from 12/04/2019. Small
hilar nodes are seen likely reactive in nature. The esophagus as
visualized is within normal limits.

Lungs/Pleura: Lungs are well aerated bilaterally. Diffuse
emphysematous changes are noted the accentuating the interstitial
markings this corresponds to the changes seen on recent chest x-ray.
A few small less than 5 mm nodules are noted in the left lower lobe.
No sizable parenchymal nodule is seen.

Upper Abdomen: No acute abnormality.

Musculoskeletal: Degenerative changes of the thoracic spine are
seen. No acute bony abnormality is noted.

Review of the MIP images confirms the above findings.
IMPRESSION: No evidence of pulmonary emboli.

Chronic reactive hilar and mediastinal lymph nodes stable from prior
CT.

Emphysematous changes stable in appearance from 5658.

Stable small left lower lobe nodules measuring 5 mm and less. No
follow-up needed if patient is low-risk (and has no known or
suspected primary neoplasm). Non-contrast chest CT can be considered
in 12 months if patient is high-risk. This recommendation follows
the consensus statement: Guidelines for Management of Incidental
Pulmonary Nodules Detected on CT Images: From the [HOSPITAL]

Emphysema ([id number]).

## 2023-03-11 ENCOUNTER — Inpatient Hospital Stay (HOSPITAL_COMMUNITY): Admit: 2023-03-11 | Payer: Medicaid Other | Admitting: Internal Medicine

## 2023-03-11 ENCOUNTER — Encounter (HOSPITAL_COMMUNITY): Payer: Self-pay

## 2024-04-28 DEATH — deceased
